# Patient Record
Sex: Female | Born: 1975 | Race: White | Hispanic: No | Marital: Married | Smoking: Current every day smoker
Health system: Southern US, Community
[De-identification: ages and names within clinical notes are randomized; demographics above are authoritative.]

## PROBLEM LIST (undated history)

## (undated) DIAGNOSIS — J45909 Unspecified asthma, uncomplicated: Secondary | ICD-10-CM

## (undated) DIAGNOSIS — M7550 Bursitis of unspecified shoulder: Secondary | ICD-10-CM

## (undated) DIAGNOSIS — G932 Benign intracranial hypertension: Secondary | ICD-10-CM

## (undated) HISTORY — PX: ENDOMETRIAL ABLATION: SHX621

## (undated) HISTORY — PX: TUBAL LIGATION: SHX77

## (undated) HISTORY — PX: APPENDECTOMY: SHX54

## (undated) HISTORY — PX: TONSILLECTOMY: SUR1361

---

## 2012-10-23 ENCOUNTER — Emergency Department (INDEPENDENT_AMBULATORY_CARE_PROVIDER_SITE_OTHER): Payer: BC Managed Care – PPO

## 2012-10-23 ENCOUNTER — Emergency Department
Admission: EM | Admit: 2012-10-23 | Discharge: 2012-10-23 | Disposition: A | Discharge status: Home / Self Care | Payer: BC Managed Care – PPO | Source: Home / Self Care | Attending: Emergency Medicine | Admitting: Emergency Medicine

## 2012-10-23 ENCOUNTER — Encounter: Payer: Self-pay | Admitting: *Deleted

## 2012-10-23 DIAGNOSIS — M5412 Radiculopathy, cervical region: Secondary | ICD-10-CM

## 2012-10-23 DIAGNOSIS — M25511 Pain in right shoulder: Secondary | ICD-10-CM

## 2012-10-23 DIAGNOSIS — M25519 Pain in unspecified shoulder: Secondary | ICD-10-CM

## 2012-10-23 DIAGNOSIS — M25521 Pain in right elbow: Secondary | ICD-10-CM

## 2012-10-23 DIAGNOSIS — M79609 Pain in unspecified limb: Secondary | ICD-10-CM

## 2012-10-23 HISTORY — DX: Benign intracranial hypertension: G93.2

## 2012-10-23 HISTORY — DX: Bursitis of unspecified shoulder: M75.50

## 2012-10-23 MED ORDER — CELECOXIB 200 MG PO CAPS: 200.0000 mg | ORAL_CAPSULE | Freq: Two times a day (BID) | ORAL | Status: AC

## 2012-10-23 NOTE — ED Provider Notes (Addendum)
History     CSN: [id number]  Arrival date & time 10/23/12  1903   First MD Initiated Contact with Patient 10/23/12 1904     Chief complaint: Right shoulder pain for 1 month    Patient is a 37 y.o. female presenting with shoulder pain. The history is provided by the patient (And also history provided by husband who is here with her).  Shoulder Pain Chronicity: Right shoulder pain. Episode onset: About one month ago, recalls no trauma. The problem occurs constantly. The problem has been gradually worsening. Pertinent negatives include no chest pain, no abdominal pain, no headaches and no shortness of breath. The symptoms are aggravated by bending (Movement, especially extension and abduction right upper extremity). Relieved by: Celebrex--but she has run out of Celebrex. Treatments tried: Aleve. The treatment provided no relief.    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 C/O right shoulder pain, and to a lesser extent right elbow pain over past month - she normally sleeps on right shoulder, and pain is usually worse after awakening - For years, she's had mild numbness right fourth and fifth digits upon awakening, but is progressively worsening the past month. In the past, Tylenol Arthritis or Aleve would provide relief. Pain significantly worse today. Originates in right shoulder and extending down entire right arm, with tingling in all digits. Today has tried Tylenol, Aleve, and 800mg  IBU without any relief. A few days ago, she tried Celebrex (she had a couple left over from prior prescription), and that significantly helped the pain . She denies headache, visual symptoms, neck pain or any midline back pain.  Past medical history: Pseudotumor cerebri  for many years. Has been on medication in the past but not in the past year. She is to see Dr. Anastasio Auerbach, neurologist in [facility].--Now sees neurologist in [facility] who does therapeutic spinal tap about every year and a half or 2 years when she has flareup of symptoms of headache or blurred vision. She denies any headache or blurred vision currently. PCP is Dr. Adonis Housekeeper at [facility].   Past Medical History  Diagnosis Date  . Pseudotumor cerebri     Not taking any meds x 1 yr  . Shoulder bursitis     as a child   Past Surgical History  Procedure Laterality Date  . Tonsillectomy    . Appendectomy    . Endometrial ablation    . Tubal ligation       no pertinent family history.  History  Substance Use Topics  . Smoking status: Current Every Day Smoker -- 1.00 packs/day    Types: Cigarettes  . Smokeless tobacco: Not on file  . Alcohol Use: No    OB History   Grav Para Term Preterm Abortions TAB SAB Ect Mult Living                  Review of Systems  Constitutional: Negative for fever and chills.  HENT: Negative.  Negative for ear pain and facial swelling.   Respiratory: Negative.  Negative for cough, chest tightness and shortness of breath.   Cardiovascular: Negative.  Negative for chest pain, palpitations and leg swelling.  Gastrointestinal: Negative.  Negative for nausea, vomiting, abdominal pain, diarrhea, blood in stool, abdominal distention and anal bleeding.  Endocrine: Positive for heat intolerance (occasional hot flashes, will followup with PCP for this). Negative for polyuria.  Genitourinary: Negative.  Musculoskeletal: Negative for back pain and gait problem.  Skin: Negative for rash.  Neurological: Positive for numbness (Right hand). Negative for tremors, seizures, speech difficulty, weakness and headaches.  Hematological: Does not bruise/bleed easily.  Psychiatric/Behavioral: Negative.  Negative for confusion.    Allergies  Codeine and Other She states that codeine has caused vomiting in the past, but no hives or rash.--Narcotics have caused hallucinations in the past. Home Medications    Current Outpatient Rx  Name  Route  Sig  Dispense  Refill  . celecoxib (CELEBREX) 200 MG capsule   Oral   Take 1 capsule (200 mg total) by mouth 2 (two) times daily.   14 capsule   0     BP 142/96  Pulse 88  Temp(Src) 98.3 F (36.8 C) (Oral)  Resp 18  Ht 5\' 4"  (1.626 m)  Wt 270 lb (122.471 kg)  BMI 46.32 kg/m2  SpO2 100%  LMP 10/14/2012  Physical Exam  Nursing note and vitals reviewed. Constitutional: She is oriented to person, place, and time. She appears well-developed and well-nourished. No distress.  Uncomfortable from right upper extremity pain, especially right shoulder  HENT:  Head: Normocephalic and atraumatic.  Mouth/Throat: Oropharynx is clear and moist.  Eyes: Conjunctivae and EOM are normal. Pupils are equal, round, and reactive to light. No scleral icterus.  Neck: Normal range of motion. Neck supple. No JVD present. No spinous process tenderness present. No Brudzinski's sign and no Kernig's sign noted.  Nontender.  Cardiovascular: Normal rate, regular rhythm and normal heart sounds.   Pulmonary/Chest: Effort normal and breath sounds normal. No respiratory distress. She exhibits no tenderness.  Abdominal: She exhibits no distension. There is no tenderness.  Musculoskeletal:       Right shoulder: She exhibits decreased range of motion and tenderness (severe tenderness,Diffusely, especially anterior, but also posterior shoulder). She exhibits no swelling, no deformity, no laceration, normal pulse and normal strength.       Right elbow: She exhibits normal range of motion, no swelling and no deformity. Tenderness (Mild to moderate diffuse tenderness) found.       Cervical back: She exhibits normal range of motion and no tenderness.  Positive empty can sign.  Negative Spurling sign.  Lymphadenopathy:    She has no cervical adenopathy.  Neurological: She is alert and oriented to person, place, and time. She has normal strength and normal reflexes. She displays no  atrophy and no tremor. A sensory deficit (Mild decreased sensation diffusely right hand) is present. No cranial nerve deficit. She displays a negative Romberg sign. Gait normal.  Reflex Scores:      Tricep reflexes are 2+ on the right side and 2+ on the left side.      Bicep reflexes are 2+ on the right side and 2+ on the left side.      Patellar reflexes are 2+ on the right side and 2+ on the left side. Skin: Skin is warm.  Psychiatric: She has a normal mood and affect.   Right shoulder continued: Pain exacerbated by abduction and extension of right shoulder.  Right wrist and hand: No deformity but minimal diffuse tenderness. No skin lesions. Radial pulse and capillary refill normal bilaterally. Hand grip normal bilaterally. Lower extremities: No calf tenderness or cords. Negative Homans sign.  ED Course  Procedures (including critical care time)  Labs Reviewed - No data to display Dg Shoulder Right  10/23/2012   *RADIOLOGY REPORT*  Clinical Data: Shoulder pain for past month.  No injury.  RIGHT SHOULDER - 2+ VIEW  Comparison: None.  Findings: Mild degenerative irregularity of the acromioclavicular joint and rotator cuff insertion. No acute fracture or dislocation. Visualized portion of the right hemithorax is normal.  IMPRESSION: Degenerative change, without acute osseous finding.   Original Report Authenticated By: Jeronimo Greaves, M.D.   Dg Elbow Complete Right  10/23/2012   *RADIOLOGY REPORT*  Clinical Data: Pain for last month.  No known injury.  RIGHT ELBOW - COMPLETE 3+ VIEW  Comparison:  None  Findings: Normal bony mineralization and alignment.  Negative for joint effusion or fracture.  No degenerative change or focal soft tissue swelling is identified.  IMPRESSION: Negative.   Original Report Authenticated By: Britta Mccreedy, M.D.     1. Right shoulder pain   2. Right elbow pain   3. Brachial neuralgia       MDM  I have reviewed the x-rays right shoulder and right elbow,  interpreted by me as negative.--No acute abnormalities. Mild degenerative changes a.c. joint and rotator cuff insertion Diagnosis is likely neuralgia right upper extremity, right shoulder pain and right elbow pain. No evidence of fracture or dislocation.--Other possibilities in the differential include bursitis, rotator cuff tendinitis, and inflammation of the a.c. joint. We discussed mechanical ways to prevent inflammation such as not sleeping on right upper extremity. Try heat or ice pack. We applied right sling/shoulder immobilizer, and that relieved some of her pain. Rx: Celebrex 200 mg twice a day. 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 I explained the importance of following up with her neurologist in [facility] in 3-5 days, or go to emergency room in [place] [facility] sooner if worse or new symptoms. She and husband voiced understanding and agreement with above plans. See detailed Instructions in AVS, which were given to patient. Verbal instructions also given. Risks, benefits, and alternatives of treatment options discussed. Questions invited and answered. Patient and husband voiced understanding and agreement with plans.         Lajean Manes, MD 10/23/12 2049  Lajean Manes, MD 11/03/12 2018

## 2012-10-23 NOTE — ED Notes (Signed)
C/O right shoulder pain over past month - usually worse after waking - along with numbness in 4th & 5th digits.  Usually Tylenol Arthritis or Aleve provides relief.  Pain significantly worse today.  Originates in right shoulder and extending down entire arm, with tingling in all digits.  Today has tried Tylenol, Aleve, and 800mg  IBU without any relief.

## 2012-10-24 ENCOUNTER — Telehealth: Payer: Self-pay | Admitting: Family Medicine

## 2012-10-29 ENCOUNTER — Telehealth: Payer: Self-pay | Admitting: Emergency Medicine

## 2012-11-21 ENCOUNTER — Emergency Department (INDEPENDENT_AMBULATORY_CARE_PROVIDER_SITE_OTHER): Payer: BC Managed Care – PPO

## 2012-11-21 ENCOUNTER — Encounter: Payer: Self-pay | Admitting: Emergency Medicine

## 2012-11-21 ENCOUNTER — Emergency Department
Admission: EM | Admit: 2012-11-21 | Discharge: 2012-11-21 | Disposition: A | Discharge status: Home / Self Care | Payer: Self-pay | Source: Home / Self Care

## 2012-11-21 DIAGNOSIS — M25539 Pain in unspecified wrist: Secondary | ICD-10-CM

## 2012-11-21 DIAGNOSIS — M79609 Pain in unspecified limb: Secondary | ICD-10-CM

## 2012-11-21 DIAGNOSIS — M25531 Pain in right wrist: Secondary | ICD-10-CM

## 2012-11-21 DIAGNOSIS — M654 Radial styloid tenosynovitis [de Quervain]: Secondary | ICD-10-CM

## 2012-11-21 LAB — COMPREHENSIVE METABOLIC PANEL
Albumin: 4.2 g/dL (ref 3.5–5.2)
BUN: 12 mg/dL (ref 6–23)
CO2: 22 mEq/L (ref 19–32)
Calcium: 8.6 mg/dL (ref 8.4–10.5)
Chloride: 107 mEq/L (ref 96–112)
Creat: 0.74 mg/dL (ref 0.50–1.10)
Glucose, Bld: 96 mg/dL (ref 70–99)
Potassium: 4.2 mEq/L (ref 3.5–5.3)

## 2012-11-21 LAB — CBC WITH DIFFERENTIAL/PLATELET
Eosinophils Absolute: 0.1 10*3/uL (ref 0.0–0.7)
Eosinophils Relative: 2 % (ref 0–5)
HCT: 37.4 % (ref 36.0–46.0)
Lymphocytes Relative: 41 % (ref 12–46)
Lymphs Abs: 2.6 10*3/uL (ref 0.7–4.0)
MCH: 30.9 pg (ref 26.0–34.0)
MCV: 89.5 fL (ref 78.0–100.0)
Monocytes Absolute: 0.5 10*3/uL (ref 0.1–1.0)
Monocytes Relative: 8 % (ref 3–12)
Platelets: 298 10*3/uL (ref 150–400)
RBC: 4.18 MIL/uL (ref 3.87–5.11)

## 2012-11-21 LAB — CK TOTAL AND CKMB (NOT AT ARMC)
CK, MB: 1.5 ng/mL (ref 0.3–4.0)
Relative Index: 1 (ref 0.0–2.5)
Total CK: 149 U/L (ref 7–177)

## 2012-11-21 MED ORDER — PREDNISONE 50 MG PO TABS: ORAL_TABLET | ORAL | Status: AC

## 2012-11-21 NOTE — ED Notes (Signed)
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 No OTCs today.

## 2012-11-21 NOTE — ED Provider Notes (Addendum)
History     CSN: [id number]  Arrival date & time 11/21/12  1423   None     Chief Complaint  Patient presents with  . 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  Pt states that she has had R wrist pain, especially with using a shovel over last 2 days Pain got to the point where she could not grip shovel or any other objects.  Pain is predominantly radial sided, over thumb.  Pain worse with thumb adduction and wrist extension.  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  This has not worsened.  Mild swelling   Past Medical History  Diagnosis Date  . Pseudotumor cerebri     Not taking any meds x 1 yr  . Shoulder bursitis     as a child    Past Surgical History  Procedure Laterality Date  . Tonsillectomy    . Appendectomy    . Endometrial ablation    . Tubal ligation      Family History  Problem Relation Age of Onset  . Cancer Mother     History  Substance Use Topics  . Smoking status: Current Every Day Smoker -- 1.00 packs/day    Types: Cigarettes  . Smokeless tobacco: Not on file  . Alcohol Use: No    OB History   Grav Para Term Preterm Abortions TAB SAB Ect Mult Living                  Review of Systems  All other systems reviewed and are negative.    Allergies  Codeine and Other  Home Medications   Current Outpatient Rx  Name  Route  Sig  Dispense  Refill  . celecoxib (CELEBREX) 200 MG capsule   Oral   Take 1 capsule (200 mg total) by mouth 2 (two) times daily.   14 capsule   0     BP 126/80  Temp(Src) 98.3 F (36.8 C) (Oral)  Resp 16  Ht 5\' 4"  (1.626 m)  Wt 273 lb (123.832 kg)  BMI 46.84 kg/m2  LMP 10/08/2012  Physical Exam  Constitutional:  Obese   HENT:  Head: Normocephalic and atraumatic.  Eyes: Conjunctivae are normal. Pupils are equal, round,  and reactive to light.  Neck: Normal range of motion. Neck supple.  Cardiovascular: Normal rate and regular rhythm.   Pulmonary/Chest: Effort normal and breath sounds normal.  Abdominal: Soft.  Musculoskeletal:  + R wrist swelling  + pain with resisted swelling  + pain over thumb with resisted thumb abduction Decreased grip strength 2/2 pain  No snuffbox tenderness Marked wrist and distal forearm swelling      Neurological: She is alert.  Skin: Skin is warm.    ED Course  Procedures (including critical care time)  Labs Reviewed - No data to display Dg Forearm Right  11/21/2012   *RADIOLOGY REPORT*  Clinical Data: Right forearm pain.  RIGHT FOREARM - 2 VIEW  Comparison: Plain films right elbow 10/23/2012.  Findings: Imaged bones, joints and soft tissues appear normal.  IMPRESSION: Negative exam.   Original Report Authenticated By: Holley Dexter, M.D.   Dg Wrist Complete Right  11/21/2012   *RADIOLOGY REPORT*  Clinical Data: Right wrist pain.  RIGHT WRIST - COMPLETE 3+ VIEW  Comparison: None.  Findings: Imaged bones, joints and soft tissues appear normal.  IMPRESSION: Negative exam.   Original Report Authenticated By: Holley Dexter, M.D.   Dg Finger Thumb Right  11/21/2012   *RADIOLOGY REPORT*  Clinical Data: Right thumb pain.  RIGHT THUMB 2+V  Comparison: None.  Findings: Imaged bones, joints and soft tissues appear normal.  IMPRESSION: Negative exam.   Original Report Authenticated By: Holley Dexter, M.D.     1. Wrist pain, acute, right   2. De Quervain's tenosynovitis, right       MDM  Xrays negative.  Suspect DeQuervains Tenosynovitis vs. Wrist extenson tendinitis DDx also includes focal myositis, though lower.  Will check ESR and CK level.  Will place on short course of prednisone.  Plan for follow up with sports medicine in 1-2 weeks.  Discussed MSK red flags.     The patient and/or caregiver has been counseled thoroughly with regard to treatment plan  and/or medications prescribed including dosage, schedule, interactions, rationale for use, and possible side effects and they verbalize understanding. Diagnoses and expected course of recovery discussed and will return if not improved as expected or if the condition worsens. Patient and/or caregiver verbalized understanding.              Doree Albee, MD 11/21/12 1541  Doree Albee, MD 11/21/12 [phone]

## 2013-07-25 IMAGING — CR DG FINGER THUMB 2+V*R*
1 series · 1 of 1 positions shown · non-contrast
Comparison: None.

CLINICAL DATA: Right thumb pain.

RIGHT THUMB 2+V

[view not recorded]
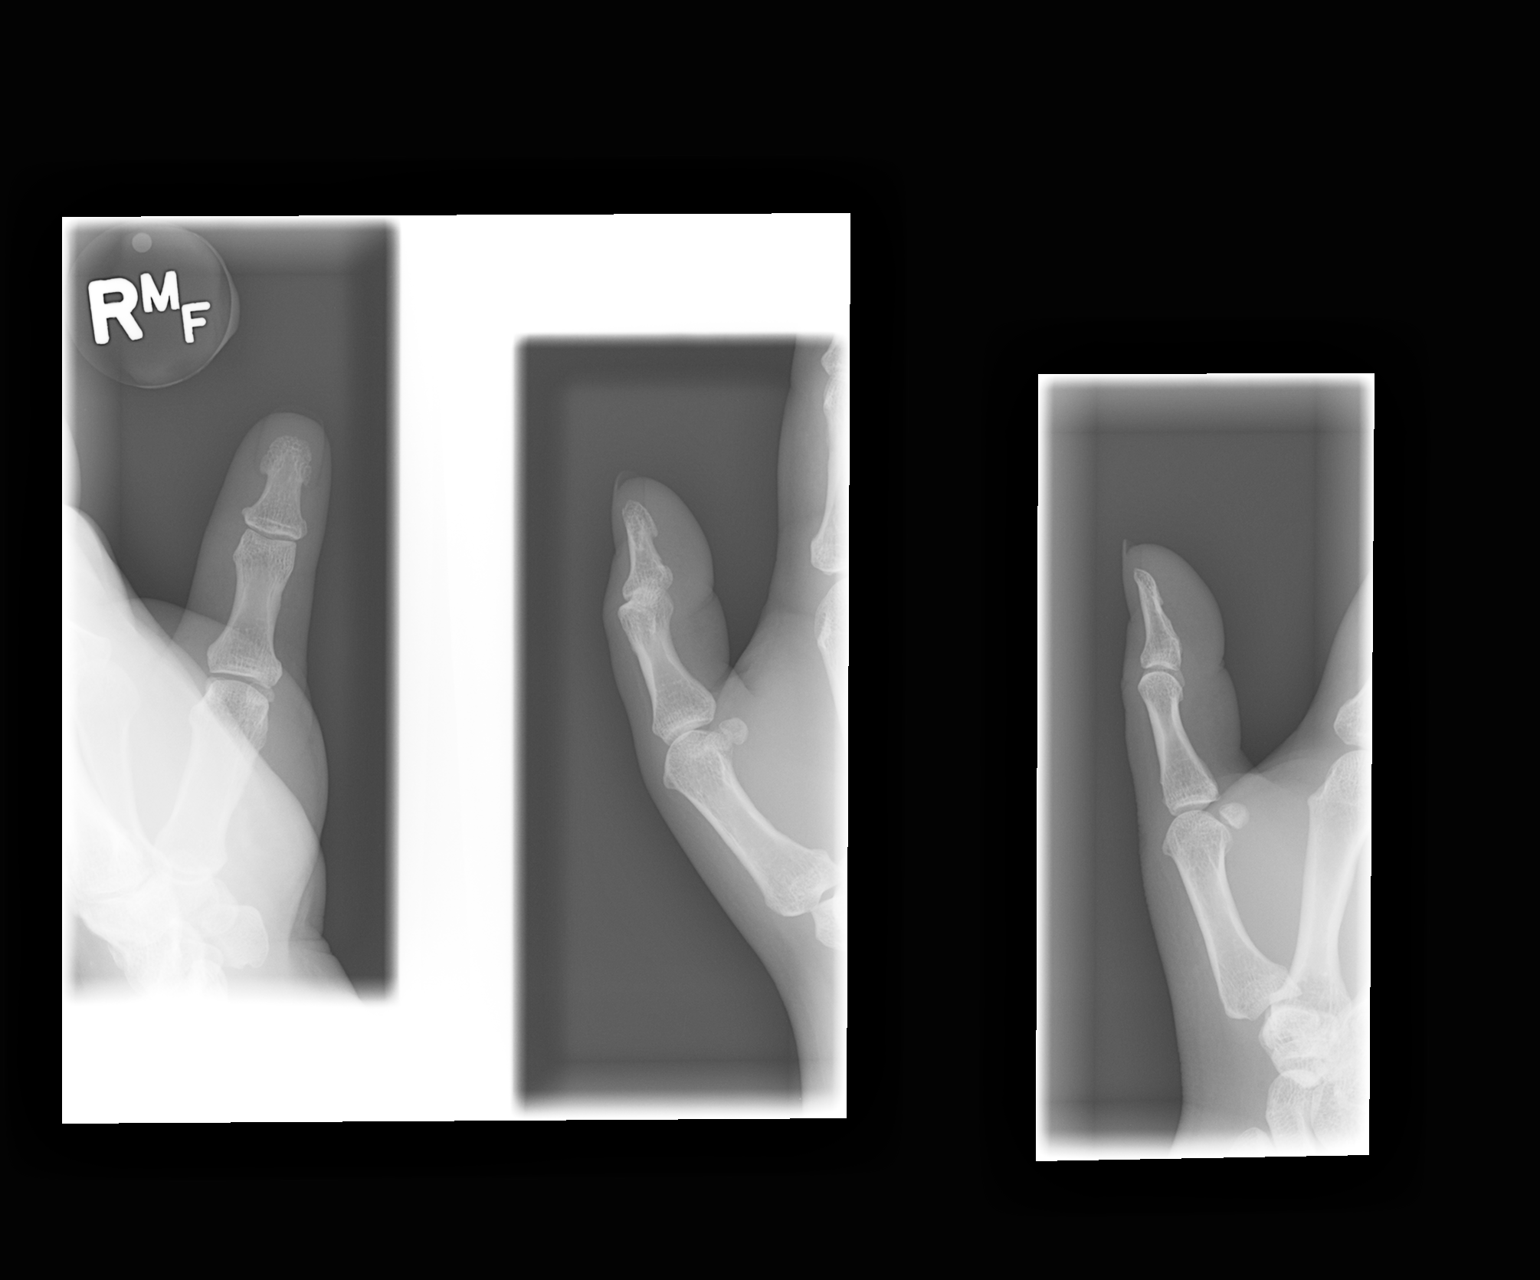

[1 of 1 positions shown; findings below may reference images not displayed]

FINDINGS: Imaged bones, joints and soft tissues appear normal.
IMPRESSION: Negative exam.

## 2014-02-10 ENCOUNTER — Encounter (HOSPITAL_BASED_OUTPATIENT_CLINIC_OR_DEPARTMENT_OTHER): Payer: Self-pay | Admitting: Emergency Medicine

## 2014-02-10 ENCOUNTER — Emergency Department (HOSPITAL_BASED_OUTPATIENT_CLINIC_OR_DEPARTMENT_OTHER): Payer: BC Managed Care – PPO

## 2014-02-10 ENCOUNTER — Emergency Department (HOSPITAL_BASED_OUTPATIENT_CLINIC_OR_DEPARTMENT_OTHER)
Admission: EM | Admit: 2014-02-10 | Discharge: 2014-02-11 | Disposition: A | Discharge status: Home / Self Care | Payer: BC Managed Care – PPO | Attending: Emergency Medicine | Admitting: Emergency Medicine

## 2014-02-10 DIAGNOSIS — J208 Acute bronchitis due to other specified organisms: Secondary | ICD-10-CM

## 2014-02-10 DIAGNOSIS — Z8739 Personal history of other diseases of the musculoskeletal system and connective tissue: Secondary | ICD-10-CM | POA: Diagnosis not present

## 2014-02-10 DIAGNOSIS — Z8669 Personal history of other diseases of the nervous system and sense organs: Secondary | ICD-10-CM | POA: Insufficient documentation

## 2014-02-10 DIAGNOSIS — Z79899 Other long term (current) drug therapy: Secondary | ICD-10-CM | POA: Diagnosis not present

## 2014-02-10 DIAGNOSIS — R0789 Other chest pain: Secondary | ICD-10-CM | POA: Diagnosis not present

## 2014-02-10 DIAGNOSIS — J45901 Unspecified asthma with (acute) exacerbation: Secondary | ICD-10-CM | POA: Insufficient documentation

## 2014-02-10 DIAGNOSIS — F17200 Nicotine dependence, unspecified, uncomplicated: Secondary | ICD-10-CM | POA: Insufficient documentation

## 2014-02-10 DIAGNOSIS — R0602 Shortness of breath: Secondary | ICD-10-CM | POA: Diagnosis present

## 2014-02-10 DIAGNOSIS — IMO0002 Reserved for concepts with insufficient information to code with codable children: Secondary | ICD-10-CM | POA: Diagnosis not present

## 2014-02-10 HISTORY — DX: Unspecified asthma, uncomplicated: J45.909

## 2014-02-10 MED ORDER — IPRATROPIUM-ALBUTEROL 0.5-2.5 (3) MG/3ML IN SOLN
3.0000 mL | Freq: Once | RESPIRATORY_TRACT | Status: AC
  Administered 2014-02-10: 3 mL via RESPIRATORY_TRACT
  Filled 2014-02-10: qty 3

## 2014-02-10 MED ORDER — PREDNISONE 50 MG PO TABS
60.0000 mg | ORAL_TABLET | Freq: Once | ORAL | Status: AC
  Administered 2014-02-10: 60 mg via ORAL
  Filled 2014-02-10 (×2): qty 1

## 2014-02-10 MED ORDER — ALBUTEROL SULFATE (2.5 MG/3ML) 0.083% IN NEBU
2.5000 mg | INHALATION_SOLUTION | Freq: Once | RESPIRATORY_TRACT | Status: AC
  Administered 2014-02-10: 2.5 mg via RESPIRATORY_TRACT
  Filled 2014-02-10: qty 3

## 2014-02-10 NOTE — ED Notes (Signed)
Patient states that she is having SOB and chest tightness today. Using in haler and not getting any better.

## 2014-02-10 NOTE — ED Provider Notes (Signed)
CSN: [id number]     Arrival date & time 02/10/14  2105 History   First MD Initiated Contact with Patient 02/10/14 2133     This chart was scribed for Dione Booze, MD by Arlan Organ, ED Scribe. This patient was seen in room MH11/MH11 and the patient's care was started 10:49 PM.   Chief Complaint  Patient presents with  . Shortness of Breath   The history is provided by the patient. No language interpreter was used.    HPI Comments: Leslie Todd is a 38 y.o. female with a PMHx of reflux who presents to the Emergency Department complaining of shortness of breath x 2 days. States when she swallows, feels as though "something is stuck in my throat". She also reports mild congestion and chest tightness. Currently rates discomfort 8/10. Since onset she has used her inhaler at home without any improvement from symptoms. She denies any cough, fever, wheezing, or diaphoresis. Pt is an every day smoker and smokes about 1 pack of cigarettes a day. Pt with known allergies to codeine and aspirin.  Pt is followed by Dr. Doreatha Lew, MD PCP  Past Medical History  Diagnosis Date  . Pseudotumor cerebri     Not taking any meds x 1 yr  . Shoulder bursitis     as a child  . Asthma    Past Surgical History  Procedure Laterality Date  . Tonsillectomy    . Appendectomy    . Endometrial ablation    . Tubal ligation     Family History  Problem Relation Age of Onset  . Cancer Mother    History  Substance Use Topics  . Smoking status: Current Every Day Smoker -- 1.00 packs/day    Types: Cigarettes  . Smokeless tobacco: Not on file  . Alcohol Use: No   OB History   Grav Para Term Preterm Abortions TAB SAB Ect Mult Living                 Review of Systems  Constitutional: Negative for fever, chills and diaphoresis.  HENT: Positive for congestion.   Respiratory: Positive for chest tightness and shortness of breath.   All other systems reviewed and are negative.     Allergies  Codeine  and Other  Home Medications   Prior to Admission medications   Medication Sig Start Date End Date Taking? Authorizing Provider  celecoxib (CELEBREX) 200 MG capsule Take 1 capsule (200 mg total) by mouth 2 (two) times daily. 10/23/12   Lajean Manes, MD  predniSONE (DELTASONE) 50 MG tablet 1 tab daily x 7 days 11/21/12   Doree Albee, MD   Triage Vitals: BP 144/86  Pulse 98  Temp(Src) 98.2 F (36.8 C) (Oral)  Resp 18  Ht  (1.626 m)  Wt 267 lb (121.11 kg)  BMI 45.81 kg/m2  SpO2 100%   Physical Exam  Nursing note and vitals reviewed. Constitutional: She is oriented to person, place, and time. She appears well-developed and well-nourished.  HENT:  Head: Normocephalic and atraumatic.  Eyes: EOM are normal. Pupils are equal, round, and reactive to light.  Neck: Normal range of motion. Neck supple. No JVD present.  Cardiovascular: Normal rate and regular rhythm.   No murmur heard. Pulmonary/Chest: Effort normal. She has wheezes. She has no rales.  Mild wheezes bilaterally  Mild tender to R anterior and posterior chest wall  Abdominal: Soft. Bowel sounds are normal. She exhibits no distension and no mass. There is no tenderness.  Musculoskeletal:  Normal range of motion. She exhibits edema.  Lymphadenopathy:    She has no cervical adenopathy.  Neurological: She is alert and oriented to person, place, and time. She has normal reflexes. No cranial nerve deficit. Coordination normal.  Skin: Skin is warm and dry. No rash noted.  Psychiatric: She has a normal mood and affect. Her behavior is normal. Thought content normal.    ED Course  Procedures (including critical care time)  DIAGNOSTIC STUDIES: Oxygen Saturation is 100% on RA, Normal by my interpretation.    COORDINATION OF CARE: 10:48 PM- Will give proventil, deltasone, duoneb treatment. Will order DG chest 2 view and EKG. Discussed treatment plan with pt at bedside and pt agreed to plan.     Imaging Review Dg Chest 2  View  02/10/2014   CLINICAL DATA:  Chest pain and shortness of breath.  EXAM: CHEST  2 VIEW  COMPARISON:  None.  FINDINGS: Cardiomediastinal silhouette is unremarkable. The lungs are clear without pleural effusions or focal consolidations. Trachea projects midline and there is no pneumothorax. Soft tissue planes and included osseous structures are non-suspicious.  IMPRESSION: No active cardiopulmonary disease.   Electronically Signed   By: Awilda Metro   On: 02/10/2014 22:07     EKG Interpretation   Date/Time:  Thursday February 10 2014 21:19:52 EDT Ventricular Rate:  94 PR Interval:  122 QRS Duration: 74 QT Interval:  362 QTC Calculation: 452 R Axis:   59 Text Interpretation:  Normal sinus rhythm Normal ECG No old tracing to  compare Confirmed by [facility]  MD, Daphna Lafuente ([id number]) on 02/10/2014 9:20:58 PM      MDM   Final diagnoses:  Acute bronchitis due to other specified organisms    Chest tightness and congestion consistent with viral bronchitis. Wheezing is noted on exam-even after initial treatment with albuterol and ipratropium. Chest x-ray shows no evidence of pneumonia. She's given dose of prednisone is given a second albuterol nebulizer treatment upon his lungs are completely clear and she states she feels much better although she is jittery from the albuterol. She is mildly tachycardic following this but it is clinically doing well. She has an albuterol inhaler at home and she is advised to use this every 4 hours as needed and she is discharged with prescription for prednisone. She is encouraged to stop smoking.  I personally performed the services described in this documentation, which was scribed in my presence. The recorded information has been reviewed and is accurate.    Dione Booze, MD 02/11/14 [phone]

## 2014-02-10 NOTE — ED Notes (Signed)
Peak flow order was not needed, and unable to cancel do to error in charting.

## 2014-02-11 MED ORDER — PREDNISONE 50 MG PO TABS: 50.0000 mg | ORAL_TABLET | Freq: Every day | ORAL | Status: AC

## 2014-02-11 NOTE — Discharge Instructions (Signed)
Acute Bronchitis Bronchitis is inflammation of the airways that extend from the windpipe into the lungs (bronchi). The inflammation often causes mucus to develop. This leads to a cough, which is the most common symptom of bronchitis.  In acute bronchitis, the condition usually develops suddenly and goes away over time, usually in a couple weeks. Smoking, allergies, and asthma can make bronchitis worse. Repeated episodes of bronchitis may cause further lung problems.  CAUSES Acute bronchitis is most often caused by the same virus that causes a cold. The virus can spread from person to person (contagious) through coughing, sneezing, and touching contaminated objects. SIGNS AND SYMPTOMS   Cough.   Fever.   Coughing up mucus.   Body aches.   Chest congestion.   Chills.   Shortness of breath.   Sore throat.  DIAGNOSIS  Acute bronchitis is usually diagnosed through a physical exam. Your health care provider will also ask you questions about your medical history. Tests, such as chest X-rays, are sometimes done to rule out other conditions.  TREATMENT  Acute bronchitis usually goes away in a couple weeks. Oftentimes, no medical treatment is necessary. Medicines are sometimes given for relief of fever or cough. Antibiotic medicines are usually not needed but may be prescribed in certain situations. In some cases, an inhaler may be recommended to help reduce shortness of breath and control the cough. A cool mist vaporizer may also be used to help thin bronchial secretions and make it easier to clear the chest.  HOME CARE INSTRUCTIONS  Get plenty of rest.   Drink enough fluids to keep your urine clear or pale yellow (unless you have a medical condition that requires fluid restriction). Increasing fluids may help thin your respiratory secretions (sputum) and reduce chest congestion, and it will prevent dehydration.   Take medicines only as directed by your health care provider.  If  you were prescribed an antibiotic medicine, finish it all even if you start to feel better.  Avoid smoking and secondhand smoke. Exposure to cigarette smoke or irritating chemicals will make bronchitis worse. If you are a smoker, consider using nicotine gum or skin patches to help control withdrawal symptoms. Quitting smoking will help your lungs heal faster.   Reduce the chances of another bout of acute bronchitis by washing your hands frequently, avoiding people with cold symptoms, and trying not to touch your hands to your mouth, nose, or eyes.   Keep all follow-up visits as directed by your health care provider.  SEEK MEDICAL CARE IF: Your symptoms do not improve after 1 week of treatment.  SEEK IMMEDIATE MEDICAL CARE IF:  You develop an increased fever or chills.   You have chest pain.   You have severe shortness of breath.  You have bloody sputum.   You develop dehydration.  You faint or repeatedly feel like you are going to pass out.  You develop repeated vomiting.  You develop a severe headache. MAKE SURE YOU:   Understand these instructions.  Will watch your condition.  Will get help right away if you are not doing well or get worse. Document Released: 07/04/2004 Document Revised: 10/11/2013 Document Reviewed: 11/17/2012 [facility] Patient Information 2015 [place], [place]. This information is not intended to replace advice given to you by your health care provider. Make sure you discuss any questions you have with your health care provider.  Prednisone tablets What is this medicine? PREDNISONE (PRED ni sone) is a corticosteroid. It is commonly used to treat inflammation of the skin,  joints, lungs, and other organs. Common conditions treated include asthma, allergies, and arthritis. It is also used for other conditions, such as blood disorders and diseases of the adrenal glands. This medicine may be used for other purposes; ask your health care provider or  pharmacist if you have questions. COMMON BRAND NAME(S): Deltasone, Predone, Sterapred, Sterapred DS What should I tell my health care provider before I take this medicine? They need to know if you have any of these conditions: -Cushing's syndrome -diabetes -glaucoma -heart disease -high blood pressure -infection (especially a virus infection such as chickenpox, cold sores, or herpes) -kidney disease -liver disease -mental illness -myasthenia gravis -osteoporosis -seizures -stomach or intestine problems -thyroid disease -an unusual or allergic reaction to lactose, prednisone, other medicines, foods, dyes, or preservatives -pregnant or trying to get pregnant -breast-feeding How should I use this medicine? Take this medicine by mouth with a glass of water. Follow the directions on the prescription label. Take this medicine with food. If you are taking this medicine once a day, take it in the morning. Do not take more medicine than you are told to take. Do not suddenly stop taking your medicine because you may develop a severe reaction. Your doctor will tell you how much medicine to take. If your doctor wants you to stop the medicine, the dose may be slowly lowered over time to avoid any side effects. Talk to your pediatrician regarding the use of this medicine in children. Special care may be needed. Overdosage: If you think you have taken too much of this medicine contact a poison control center or emergency room at once. NOTE: This medicine is only for you. Do not share this medicine with others. What if I miss a dose? If you miss a dose, take it as soon as you can. If it is almost time for your next dose, talk to your doctor or health care professional. You may need to miss a dose or take an extra dose. Do not take double or extra doses without advice. What may interact with this medicine? Do not take this medicine with any of the following medications: -metyrapone -mifepristone This  medicine may also interact with the following medications: -aminoglutethimide -amphotericin B -aspirin and aspirin-like medicines -barbiturates -certain medicines for diabetes, like glipizide or glyburide -cholestyramine -cholinesterase inhibitors -cyclosporine -digoxin -diuretics -ephedrine -female hormones, like estrogens and birth control pills -isoniazid -ketoconazole -NSAIDS, medicines for pain and inflammation, like ibuprofen or naproxen -phenytoin -rifampin -toxoids -vaccines -warfarin This list may not describe all possible interactions. Give your health care provider a list of all the medicines, herbs, non-prescription drugs, or dietary supplements you use. Also tell them if you smoke, drink alcohol, or use illegal drugs. Some items may interact with your medicine. What should I watch for while using this medicine? Visit your doctor or health care professional for regular checks on your progress. If you are taking this medicine over a prolonged period, carry an identification card with your name and address, the type and dose of your medicine, and your doctor's name and address. This medicine may increase your risk of getting an infection. Tell your doctor or health care professional if you are around anyone with measles or chickenpox, or if you develop sores or blisters that do not heal properly. If you are going to have surgery, tell your doctor or health care professional that you have taken this medicine within the last twelve months. Ask your doctor or health care professional about your diet. You may  need to lower the amount of salt you eat. This medicine may affect blood sugar levels. If you have diabetes, check with your doctor or health care professional before you change your diet or the dose of your diabetic medicine. What side effects may I notice from receiving this medicine? Side effects that you should report to your doctor or health care professional as soon as  possible: -allergic reactions like skin rash, itching or hives, swelling of the face, lips, or tongue -changes in emotions or moods -changes in vision -depressed mood -eye pain -fever or chills, cough, sore throat, pain or difficulty passing urine -increased thirst -swelling of ankles, feet Side effects that usually do not require medical attention (report to your doctor or health care professional if they continue or are bothersome): -confusion, excitement, restlessness -headache -nausea, vomiting -skin problems, acne, thin and shiny skin -trouble sleeping -weight gain This list may not describe all possible side effects. Call your doctor for medical advice about side effects. You may report side effects to FDA at 1-800-FDA-1088. Where should I keep my medicine? Keep out of the reach of children. Store at room temperature between 15 and 30 degrees C (59 and 86 degrees F). Protect from light. Keep container tightly closed. Throw away any unused medicine after the expiration date. NOTE: This sheet is a summary. It may not cover all possible information. If you have questions about this medicine, talk to your doctor, pharmacist, or health care provider.  2015, [employer]. (2011-01-10 10:57:14)  Smoking Hazards Smoking cigarettes is extremely bad for your health. Tobacco smoke has over 200 known poisons in it. It contains the poisonous gases nitrogen oxide and carbon monoxide. There are over 60 chemicals in tobacco smoke that cause cancer. Some of the chemicals found in cigarette smoke include:   Cyanide.   Benzene.   Formaldehyde.   Methanol (wood alcohol).   Acetylene (fuel used in welding torches).   Ammonia.  Even smoking lightly shortens your life expectancy by several years. You can greatly reduce the risk of medical problems for you and your family by stopping now. Smoking is the most preventable cause of death and disease in our society. Within days of  quitting smoking, your circulation improves, you decrease the risk of having a heart attack, and your lung capacity improves. There may be some increased phlegm in the first few days after quitting, and it may take months for your lungs to clear up completely. Quitting for 10 years reduces your risk of developing lung cancer to almost that of a nonsmoker.  WHAT ARE THE RISKS OF SMOKING? Cigarette smokers have an increased risk of many serious medical problems, including:  Lung cancer.   Lung disease (such as pneumonia, bronchitis, and emphysema).   Heart attack and chest pain due to the heart not getting enough oxygen (angina).   Heart disease and peripheral blood vessel disease.   Hypertension.   Stroke.   Oral cancer (cancer of the lip, mouth, or voice box).   Bladder cancer.   Pancreatic cancer.   Cervical cancer.   Pregnancy complications, including premature birth.   Stillbirths and smaller newborn babies, birth defects, and genetic damage to sperm.   Early menopause.   Lower estrogen level for women.   Infertility.   Facial wrinkles.   Blindness.   Increased risk of broken bones (fractures).   Senile dementia.   Stomach ulcers and internal bleeding.   Delayed wound healing and increased risk of complications during surgery. Because  of secondhand smoke exposure, children of smokers have an increased risk of the following:   Sudden infant death syndrome (SIDS).   Respiratory infections.   Lung cancer.   Heart disease.   Ear infections.  WHY IS SMOKING ADDICTIVE? Nicotine is the chemical agent in tobacco that is capable of causing addiction or dependence. When you smoke and inhale, nicotine is absorbed rapidly into the bloodstream through your lungs. Both inhaled and noninhaled nicotine may be addictive.  WHAT ARE THE BENEFITS OF QUITTING?  There are many health benefits to quitting smoking. Some are:   The likelihood of  developing cancer and heart disease decreases. Health improvements are seen almost immediately.   Blood pressure, pulse rate, and breathing patterns start returning to normal soon after quitting.   People who quit may see an improvement in their overall quality of life.  HOW DO YOU QUIT SMOKING? Smoking is an addiction with both physical and psychological effects, and longtime habits can be hard to change. Your health care provider can recommend:  Programs and community resources, which may include group support, education, or therapy.  Replacement products, such as patches, gum, and nasal sprays. Use these products only as directed. Do not replace cigarette smoking with electronic cigarettes (commonly called e-cigarettes). The safety of e-cigarettes is unknown, and some may contain harmful chemicals. FOR MORE INFORMATION  American Lung Association: www.lung.org  American Cancer Society: www.cancer.org Document Released: 07/04/2004 Document Revised: 03/17/2013 Document Reviewed: 11/16/2012 [facility] Patient Information 2015 [place], [place]. This information is not intended to replace advice given to you by your health care provider. Make sure you discuss any questions you have with your health care provider.

## 2015-05-16 IMAGING — CR DG CHEST 2V
2 series · 2 of 2 positions shown · non-contrast
Comparison: None.

CLINICAL DATA: Chest pain and shortness of breath.

EXAM:
CHEST  2 VIEW

[w chest pa]
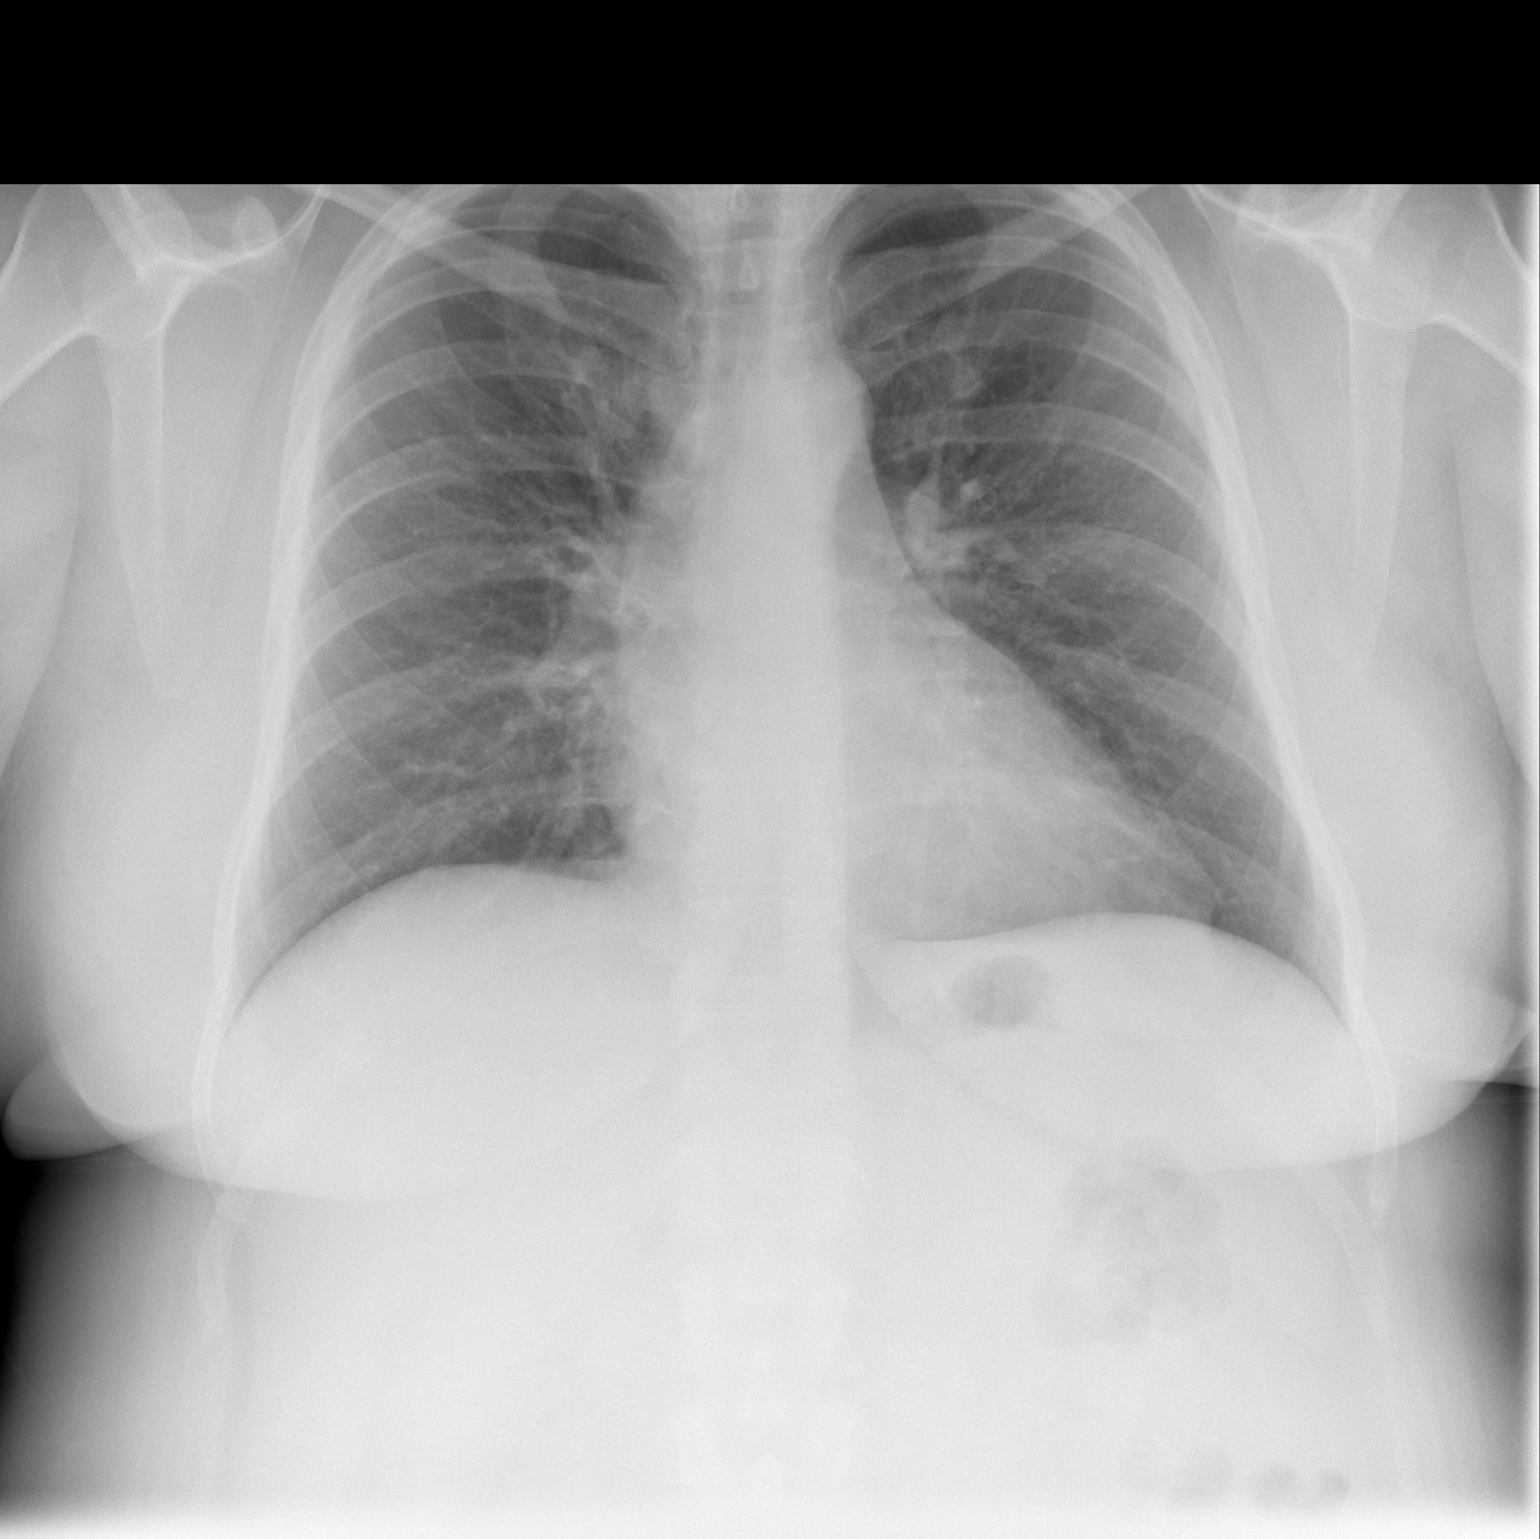

[w chest lat]
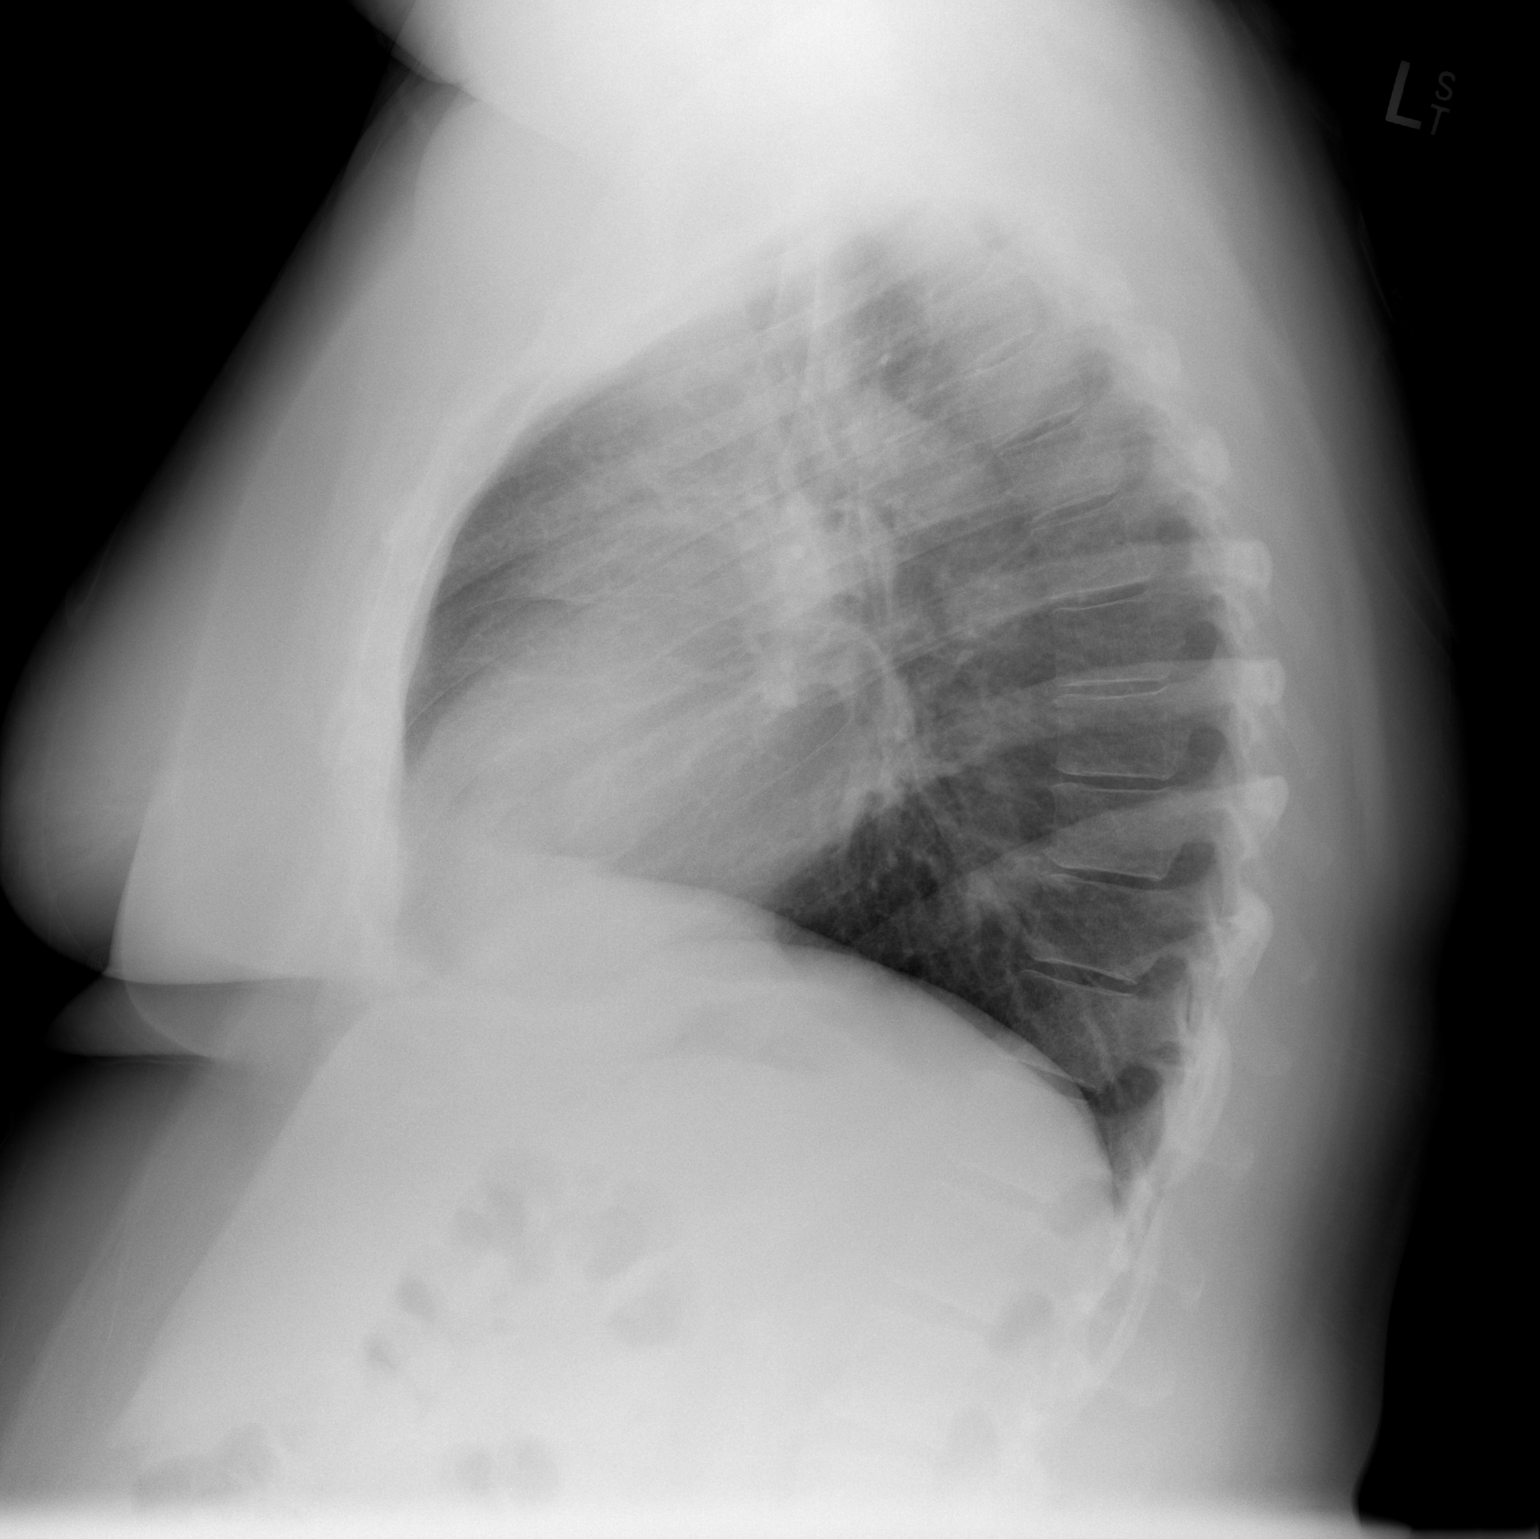

[2 of 2 positions shown; findings below may reference images not displayed]

FINDINGS: Cardiomediastinal silhouette is unremarkable. The lungs are clear
without pleural effusions or focal consolidations. Trachea projects
midline and there is no pneumothorax. Soft tissue planes and
included osseous structures are non-suspicious.
IMPRESSION: No active cardiopulmonary disease.

  By: [employer]

## 2022-06-28 ENCOUNTER — Emergency Department (HOSPITAL_BASED_OUTPATIENT_CLINIC_OR_DEPARTMENT_OTHER)
Admission: EM | Admit: 2022-06-28 | Discharge: 2022-06-28 | Disposition: A | Discharge status: Home / Self Care | Payer: Commercial Managed Care - PPO | Attending: Emergency Medicine | Admitting: Emergency Medicine

## 2022-06-28 ENCOUNTER — Other Ambulatory Visit: Payer: Self-pay

## 2022-06-28 ENCOUNTER — Emergency Department (HOSPITAL_BASED_OUTPATIENT_CLINIC_OR_DEPARTMENT_OTHER): Payer: Commercial Managed Care - PPO

## 2022-06-28 DIAGNOSIS — M79641 Pain in right hand: Secondary | ICD-10-CM | POA: Insufficient documentation

## 2022-06-28 DIAGNOSIS — J45909 Unspecified asthma, uncomplicated: Secondary | ICD-10-CM | POA: Insufficient documentation

## 2022-06-28 MED ORDER — MELOXICAM 15 MG PO TABS: 15.0000 mg | ORAL_TABLET | Freq: Every day | ORAL | 0 refills | Status: AC

## 2022-06-28 MED ORDER — METHOCARBAMOL 500 MG PO TABS: 500.0000 mg | ORAL_TABLET | Freq: Two times a day (BID) | ORAL | 0 refills | Status: AC

## 2022-06-28 NOTE — ED Notes (Signed)
Pt A&OX4 ambulatory at d/c with independent steady gait. Pt verbalized understanding of d/c instructions, prescription and follow up care. 
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
## 2022-06-28 NOTE — Discharge Instructions (Signed)
You were evaluated today for hand pain. Your x-ray was reassuring for no signs of fracture or dislocation.  Your muscle soreness in your shoulders and the right side of your back will likely worsen before it improves over the next few days.  I have prescribed an anti-inflammatory medication.  Please do not take any other NSAID medications while taking the meloxicam.  I also prescribed methocarbamol which is a muscle relaxant.  Do not take if you are planning to drive or operate heavy machinery as this will likely cause drowsiness.  Please follow-up as needed with your primary care provider.  I have provided contact information for a hand surgeon in case your hand continues to have pain
